# Patient Record
Sex: Male | Born: 1970 | Race: White | Hispanic: No | Marital: Married | Smoking: Never smoker
Health system: Southern US, Community
[De-identification: ages and names within clinical notes are randomized; demographics above are authoritative.]

## PROBLEM LIST (undated history)

## (undated) DIAGNOSIS — T7840XA Allergy, unspecified, initial encounter: Secondary | ICD-10-CM

## (undated) HISTORY — DX: Allergy, unspecified, initial encounter: T78.40XA

---

## 2008-04-11 HISTORY — PX: VASECTOMY: SHX75

## 2013-10-25 ENCOUNTER — Emergency Department (HOSPITAL_BASED_OUTPATIENT_CLINIC_OR_DEPARTMENT_OTHER)
Admission: EM | Admit: 2013-10-25 | Discharge: 2013-10-25 | Disposition: A | Discharge status: Home / Self Care | Payer: 59 | Attending: Emergency Medicine | Admitting: Emergency Medicine

## 2013-10-25 ENCOUNTER — Emergency Department (HOSPITAL_BASED_OUTPATIENT_CLINIC_OR_DEPARTMENT_OTHER): Payer: 59

## 2013-10-25 ENCOUNTER — Encounter (HOSPITAL_BASED_OUTPATIENT_CLINIC_OR_DEPARTMENT_OTHER): Payer: Self-pay | Admitting: Emergency Medicine

## 2013-10-25 DIAGNOSIS — X500XXA Overexertion from strenuous movement or load, initial encounter: Secondary | ICD-10-CM | POA: Insufficient documentation

## 2013-10-25 DIAGNOSIS — S92213A Displaced fracture of cuboid bone of unspecified foot, initial encounter for closed fracture: Secondary | ICD-10-CM | POA: Insufficient documentation

## 2013-10-25 DIAGNOSIS — Y9302 Activity, running: Secondary | ICD-10-CM | POA: Insufficient documentation

## 2013-10-25 DIAGNOSIS — S92211A Displaced fracture of cuboid bone of right foot, initial encounter for closed fracture: Secondary | ICD-10-CM

## 2013-10-25 DIAGNOSIS — Y9289 Other specified places as the place of occurrence of the external cause: Secondary | ICD-10-CM | POA: Insufficient documentation

## 2013-10-25 MED ORDER — HYDROCODONE-ACETAMINOPHEN 5-325 MG PO TABS: 1.0000 | ORAL_TABLET | ORAL | Status: DC | PRN

## 2013-10-25 NOTE — ED Notes (Signed)
MD at bedside. 

## 2013-10-25 NOTE — ED Notes (Signed)
Right foot injury tonight when he was running and stepped off a curb.

## 2013-10-25 NOTE — ED Notes (Signed)
Patient transported to X-ray 

## 2013-10-25 NOTE — ED Provider Notes (Signed)
CSN: [id number]     Arrival date & time 10/25/13  2052 History  This chart was scribed for Anthony Mccormick, * by Irene Pap, ED Scribe. This patient was seen in room MH01/MH01 and patient care was started at 9:01 PM.   Chief Complaint  Patient presents with  . Foot Injury   The history is provided by the patient. No language interpreter was used.   HPI Comments: Wise Fees is a 43 y.o. male who presents to the Emergency Department complaining of right foot injury onset a few hours ago. He states that he was running and missed the curb when he heard a pop in his right foot. He reports associated swelling to the area.   History reviewed. No pertinent past medical history. History reviewed. No pertinent past surgical history. No family history on file. History  Substance Use Topics  . Smoking status: Never Smoker   . Smokeless tobacco: Not on file  . Alcohol Use: Yes    Review of Systems  Musculoskeletal: Positive for arthralgias.  All other systems reviewed and are negative.   Allergies  Review of patient's allergies indicates no known allergies.  Home Medications   Prior to Admission medications   Not on File   BP 128/77  Pulse 75  Temp(Src) 98.4 F (36.9 C) (Oral)  Resp 20  Ht 6' (1.829 m)  Wt 155 lb (70.308 kg)  BMI 21.02 kg/m2  SpO2 100% Physical Exam  Constitutional: He is oriented to person, place, and time. He appears well-developed and well-nourished. No distress.  HENT:  Head: Normocephalic and atraumatic.  Right Ear: Hearing normal.  Left Ear: Hearing normal.  Nose: Nose normal.  Mouth/Throat: Oropharynx is clear and moist and mucous membranes are normal.  Eyes: Conjunctivae and EOM are normal. Pupils are equal, round, and reactive to light.  Neck: Normal range of motion. Neck supple.  Cardiovascular: Regular rhythm, S1 normal and S2 normal.  Exam reveals no gallop and no friction rub.   No murmur heard. Pulmonary/Chest: Effort normal and  breath sounds normal. No respiratory distress. He exhibits no tenderness.  Abdominal: Soft. Normal appearance and bowel sounds are normal. There is no hepatosplenomegaly. There is no tenderness. There is no rebound, no guarding, no tenderness at McBurney's point and negative Murphy's sign. No hernia.  Musculoskeletal: Normal range of motion.  Tenderness to right foot. Ecchymosis and swelling noted.   Neurological: He is alert and oriented to person, place, and time. He has normal strength. No cranial nerve deficit or sensory deficit. Coordination normal. GCS eye subscore is 4. GCS verbal subscore is 5. GCS motor subscore is 6.  Skin: Skin is warm, dry and intact. No rash noted. No cyanosis.  Psychiatric: He has a normal mood and affect. His speech is normal and behavior is normal. Thought content normal.    ED Course  Procedures (including critical care time) DIAGNOSTIC STUDIES: Oxygen Saturation is 100% on room air, normal by my interpretation.    COORDINATION OF CARE: 9:04 PM-Discussed treatment plan which includes x-ray with pt at bedside and pt agreed to plan.   Labs Review Labs Reviewed - No data to display  Imaging Review Dg Foot Complete Right  10/25/2013   CLINICAL DATA:  Right foot injury.  Foot pain.  EXAM: RIGHT FOOT COMPLETE - 3+ VIEW  COMPARISON:  None.  FINDINGS: A mildly comminuted fracture is seen involving the cuboid. No other fractures are identified. No evidence of dislocation.  IMPRESSION: Mildly comminuted fracture of  the cuboid.   Electronically Signed   By: Earle Gell M.D.   On: 10/25/2013 21:16     EKG Interpretation None      MDM   Final diagnoses:  None   foot fracture  Patient presents to the emergency department for evaluation of right foot injury. Patient jumped off a curb and felt a pop in the outside portion of his right foot. He presents with marked swelling and some ecchymosis in the region. X-ray confirms cuboid fracture. Patient placed in  posterior splint, nonweightbearing status. Vicodin as needed for pain. Patient reports that he will followup with his orthopedist in [facility], was also given a referral for our on-call orthopedist in the event that he cannot get an appointment in [facility].  I personally performed the services described in this documentation, which was scribed in my presence. The recorded information has been reviewed and is accurate.     Anthony Greek, MD 10/25/13 2203

## 2013-10-25 NOTE — Discharge Instructions (Signed)
Tarsal Fracture  with Rehab The tarsal bones are a collection of 7 bones that collectively make up the back half of the foot (hind foot). A tarsal fracture is a break in one of these bones. The 7 tarsal bones are the ankle bone (talus), heel bone (calcaneus), the 4 cuneiform bones, the cuboid, and the navicular. SYMPTOMS   Severe pain at the time of injury, which may persist for several weeks.  Tenderness, inflammation, or bruising (contusion) at the fracture site.  Swelling within the foot, causing numbness and paralysis, which are signs of nerve and blood vessel damage (uncommon). CAUSES  Tarsal fractures occur when a force is placed on the bone that is greater than the bone can withstand. Common causes of injury include:  Direct trauma (injury from impact) to the foot.  Awkwardly landing on the foot after jumping.  Twisting injury to the ankle and foot. RISK INCREASES WITH:  Contact sports (football, rugby, or lacrosse).  Jumping sports (basketball or volleyball).  Previous ankle or foot injury.  Poor strength and flexibility. PREVENTION  Warm up and stretch properly before activity.  Maintain physical fitness:  Strength, flexibility, and endurance.  Cardiovascular fitness (increases heart rate).  Protect the ankle and foot with taping, braces, or compression bandages.  Wear properly fitted shoes that are appropriate for the activity. PROGNOSIS  If treated properly, tarsal fractures can be expected to heal with non-surgical treatment. Occasionally, surgery is necessary to treat bone fragments that are out of alignment (displaced fracture). RELATED COMPLICATIONS   Failure of the fracture to heal (nonunion).  Healing of the fracture in a poor position (malunion).  Recurring symptoms that result in a chronic problem.  Prolonged healing time, if improperly treated or re-injured.  Bone death, due to poor circulation to the fracture site (rare).  Arthritis of the  foot. TREATMENT  Treatment first involves the use of ice and medicine, to reduce pain and inflammation. If the bone fragments are out of alignment (displaced fracture), then the bone must be immediately realigned (reduction) by a trained professional. Fractures that cannot be realigned by hand, or fractures where the bones poke out through the skin (open fracture), may require surgery to hold the fracture in place with screws, pins, and plates. Once in proper alignment, the foot and ankle must be kept still for a period of time to allow for healing. After this period, it is important to perform strengthening and stretching exercises to help regain strength and a full range of motion. These exercises may be completed at home or with a therapist.  MEDICATION   If pain medicine is necessary, nonsteroidal anti-inflammatory medications (aspirin and ibuprofen) or other minor pain relievers (acetaminophen) are often recommended.  Do not take pain medication for 7 days before surgery.  Prescription pain relievers may be given if your caregiver thinks they are necessary. Use only as directed and only as much as you need. COLD THERAPY  Cold treatment (icing) relieves pain and reduces inflammation. Cold treatment should be applied for 10 to 15 minutes every 2 to 3 hours, and immediately after any activity that aggravates your symptoms. Use ice packs or an ice massage. SEEK MEDICAL CARE IF:   Treatment does not seem to help, or the condition worsens.  Any medicines produce negative side effects.  Any complications from surgery occur:  Pain, numbness, or coldness in the affected foot.  Discoloration beneath the toenails (blue or gray) of the affected foot.  Signs of infections (fever, pain, inflammation, redness,  or persistent bleeding).

## 2015-08-28 IMAGING — CR DG FOOT COMPLETE 3+V*R*
3 series · 3 of 3 positions shown · non-contrast
Comparison: None.

CLINICAL DATA: Right foot injury.  Foot pain.

EXAM:
RIGHT FOOT COMPLETE - 3+ VIEW

[t foot ap right]
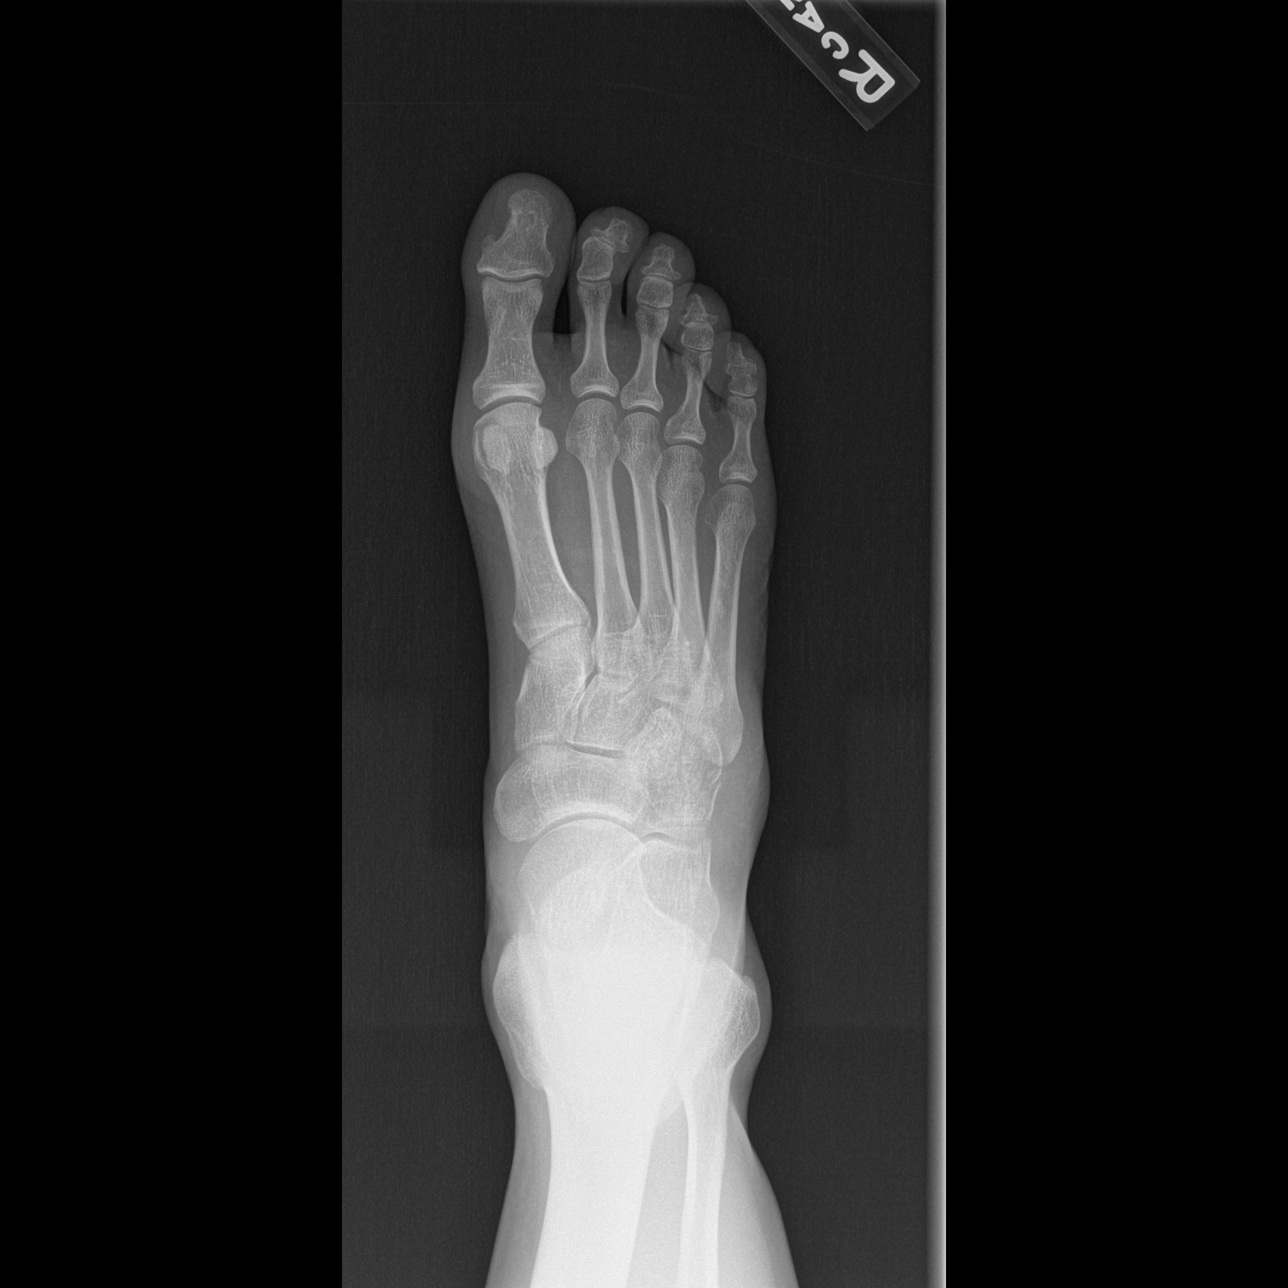

[t foot oblique right]
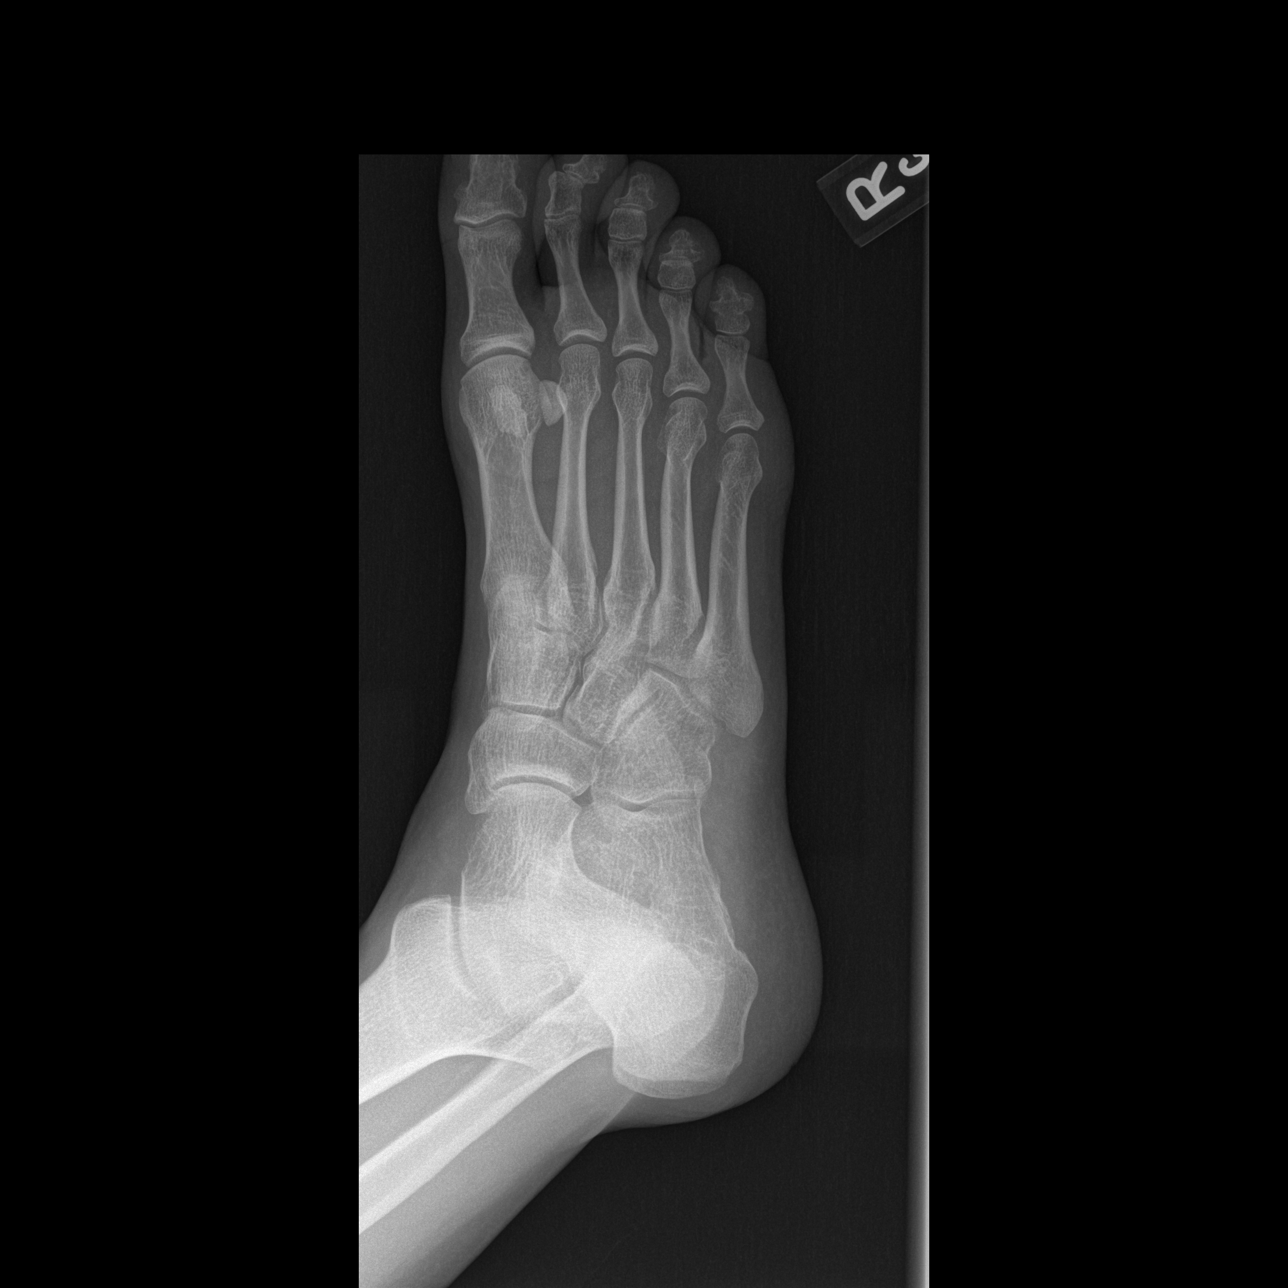

[t foot lat right]
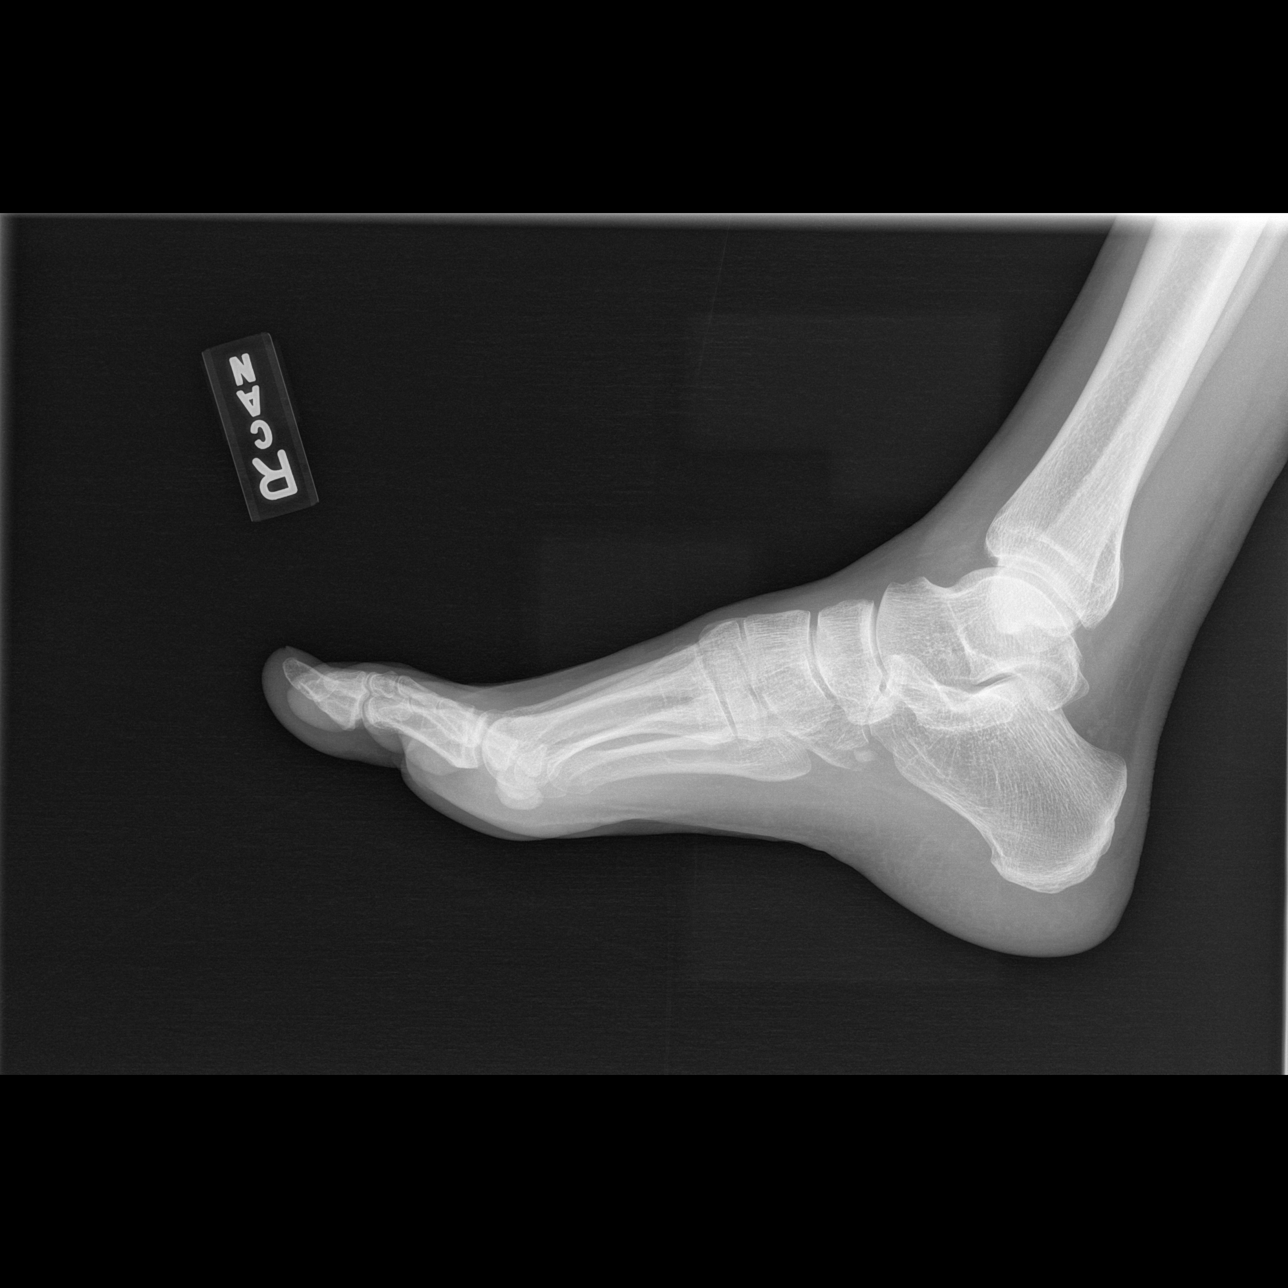

[3 of 3 positions shown; findings below may reference images not displayed]

FINDINGS: A mildly comminuted fracture is seen involving the cuboid. No other
fractures are identified. No evidence of dislocation.
IMPRESSION: Mildly comminuted fracture of the cuboid.

## 2019-06-14 ENCOUNTER — Ambulatory Visit: Payer: 59 | Attending: Internal Medicine

## 2019-06-14 DIAGNOSIS — Z23 Encounter for immunization: Secondary | ICD-10-CM | POA: Insufficient documentation

## 2019-06-14 NOTE — Progress Notes (Signed)
   Covid-19 Vaccination Clinic  Name:  Elwood Gerhard    MRN: [id number] DOB: [birth year]  06/14/2019  Mr. Oclair was observed post Covid-19 immunization for 15 minutes without incident. He was provided with Vaccine Information Sheet and instruction to access the V-Safe system.   Mr. Gima was instructed to call 911 with any severe reactions post vaccine: Marland Kitchen Difficulty breathing  . Swelling of face and throat  . A fast heartbeat  . A bad rash all over body  . Dizziness and weakness

## 2019-07-10 ENCOUNTER — Ambulatory Visit: Payer: 59 | Attending: Internal Medicine

## 2019-07-10 DIAGNOSIS — Z23 Encounter for immunization: Secondary | ICD-10-CM

## 2019-07-10 NOTE — Progress Notes (Signed)
   Covid-19 Vaccination Clinic  Name:  Anthony Mccormick    MRN: [id number] DOB: Jul 19, 1970  07/10/2019  [name] was observed post Covid-19 immunization for 15 minutes without incident. He was provided with Vaccine Information Sheet and instruction to access the V-Safe system.   [name] was instructed to call 911 with any severe reactions post vaccine: Marland Kitchen Difficulty breathing  . Swelling of face and throat  . A fast heartbeat  . A bad rash all over body  . Dizziness and weakness   Immunizations Administered    Name Date Dose VIS Date Route   Pfizer COVID-19 Vaccine 07/10/2019  3:15 PM 0.3 mL 03/22/2019 Intramuscular   Manufacturer: [place]   Lot: [id number]   Benedict: [id number]

## 2021-01-22 ENCOUNTER — Encounter: Payer: Self-pay | Admitting: Family Medicine

## 2021-01-22 ENCOUNTER — Ambulatory Visit (INDEPENDENT_AMBULATORY_CARE_PROVIDER_SITE_OTHER): Payer: 59 | Admitting: Family Medicine

## 2021-01-22 ENCOUNTER — Other Ambulatory Visit: Payer: Self-pay

## 2021-01-22 VITALS — BP 122/74 | HR 59 | Temp 98.1°F | Ht 71.0 in | Wt 178.0 lb

## 2021-01-22 DIAGNOSIS — Z0000 Encounter for general adult medical examination without abnormal findings: Secondary | ICD-10-CM

## 2021-01-22 DIAGNOSIS — Z125 Encounter for screening for malignant neoplasm of prostate: Secondary | ICD-10-CM | POA: Diagnosis not present

## 2021-01-22 DIAGNOSIS — Z114 Encounter for screening for human immunodeficiency virus [HIV]: Secondary | ICD-10-CM

## 2021-01-22 DIAGNOSIS — C4491 Basal cell carcinoma of skin, unspecified: Secondary | ICD-10-CM | POA: Insufficient documentation

## 2021-01-22 DIAGNOSIS — Z23 Encounter for immunization: Secondary | ICD-10-CM | POA: Diagnosis not present

## 2021-01-22 DIAGNOSIS — Z1159 Encounter for screening for other viral diseases: Secondary | ICD-10-CM | POA: Diagnosis not present

## 2021-01-22 DIAGNOSIS — Z1211 Encounter for screening for malignant neoplasm of colon: Secondary | ICD-10-CM

## 2021-01-22 NOTE — Progress Notes (Signed)
[facility] [address] [place] [place] [address] Dept: [phone] Dept Fax: [phone]  New Patient Office Visit  Subjective:    Patient ID: Anthony Mccormick, male    DOB: December 10, 1970, 50 y.o..   MRN: [id number]  Chief Complaint  Patient presents with   Establish Care    NP- establish care.  Wants to get colonoscopy scheduled.  Wants flu shot today.     History of Present Illness:  Patient is in today to establish care. Anthony Mccormick was born in [place], [place]. He attended college at [employer], majoring in Health visitor. He moved to [place] in 1993. 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 He has been married for 24 years. He has a daughter (4, Anthony [employer]cc[employer]ick) and a son (81). He denies tobacco or drug use. He admits to drinkign several beers each evening.  Anthony Mccormick has a history fo multiple basal cell carcinomas. He sees Anthony Mccormick at [facility]. He has had multiple lesions frozen, but did have one excised on the right chest wall. He goes in 3 times a year to monitor for these.  Review of Systems  Constitutional: Negative.   HENT: Negative.    Eyes: Negative.   Respiratory:  Negative for cough, shortness of breath and wheezing.   Cardiovascular:  Positive for chest pain and palpitations. Negative for orthopnea, claudication, leg swelling and PND.       Notes episodes over the past year of sharp chest pain that lasts for seconds. He isn't sure if he feels a rapid heart rate or not. He has no associated dyspnea.  Gastrointestinal: Negative.   Genitourinary: Negative.   Musculoskeletal: Negative.   Skin: Negative.   Neurological: Negative.   Endo/Heme/Allergies: Negative.   Psychiatric/Behavioral: Negative.     Past Medical History: Patient Active Problem List   Diagnosis Date Noted   Basal cell carcinoma 01/22/2021   Past Surgical  History:  Procedure Laterality Date   VASECTOMY  2010   Family History  Problem Relation Age of Onset   Heart disease Mother    Stroke Mother    Cancer Paternal Aunt        Ovarian   Dementia Maternal Grandmother    Heart disease Maternal Grandfather    Heart disease Paternal Grandmother    Heart disease Paternal Grandfather    Outpatient Medications Prior to Visit  Medication Sig Dispense Refill   HYDROcodone-acetaminophen (NORCO/VICODIN) 5-325 MG per tablet Take 1-2 tablets by mouth every 4 (four) hours as needed for moderate pain. 30 tablet 0   No facility-administered medications prior to visit.   No Known Allergies    Objective:   Today's Vitals   01/22/21 1307  BP: 122/74  Pulse: (!) 59  Temp: 98.1 F (36.7 C)  TempSrc: Temporal  SpO2: 98%  Weight: 178 lb (80.7 kg)  Height: 5\' 11"  (1.803 m)   Body mass index is 24.83 kg/m.   General: Well developed, well nourished. No acute distress. HEENT: Normocephalic, non-traumatic. PERRL, EOMI. Conjunctiva clear. Fundiscopic exam   shows normal disc and vasculature. External ears normal. EAC and TMs normal bilaterally.   Nose clear without congestion or rhinorrhea. Mucous membranes moist. Oropharynx clear.   Good dentition. Neck: Supple. No lymphadenopathy. No thyromegaly. Lungs: Clear to auscultation bilaterally. No wheezing, rales or rhonchi. CV: RRR without murmurs or rubs. Pulses 2+ bilaterally. Abdomen: Soft, non-tender. Bowel sounds positive, normal pitch and frequency.  No masses. Psych: Alert and oriented. Normal mood and affect.  Health Maintenance Due  Topic Date Due   Hepatitis C Screening  Never done   COLONOSCOPY (Pts 45-28yrs Insurance coverage will need to be confirmed)  Never done   Zoster Vaccines- Shingrix (1 of 2) Never done   INFLUENZA VACCINE  Never done     Assessment & Plan:   1. Encounter for preventative adult health care examination Overall excellent health. We reviewed recommended  screenings. [name] notes he had a lipid panel done for work. He will provide me with a copy.  2. Encounter for hepatitis C screening test for low risk patient  - HCV Ab w Reflex to Quant PCR  3. Screening for HIV (human immunodeficiency virus)  - HIV Antibody (routine testing w rflx)  4. Screening for prostate cancer Discussed screening for prostate cancer. After engaging in shared decision-making, we will move ahead with testing.  - PSA  5. Screening for colon cancer  - Ambulatory referral to Gastroenterology  6. Need for shingles vaccine  - Varicella-zoster vaccine IM (Shingrix)  7. Need for influenza vaccination  - Flu Vaccine QUAD 6+ mos PF IM (Fluarix Quad PF)  Anthony Salter, MD

## 2021-01-23 LAB — HCV AB W REFLEX TO QUANT PCR: HCV Ab: <0.1 s/co ratio (ref 0.0–0.9)

## 2021-01-23 LAB — HCV INTERPRETATION

## 2021-01-25 LAB — PSA: PSA: 0.43 ng/mL (ref <=4.00)

## 2021-01-25 LAB — HIV ANTIBODY (ROUTINE TESTING W REFLEX): HIV 1&2 Ab, 4th Generation: NONREACTIVE

## 2021-02-03 ENCOUNTER — Encounter: Payer: Self-pay | Admitting: Gastroenterology

## 2021-03-25 ENCOUNTER — Other Ambulatory Visit: Payer: Self-pay

## 2021-03-25 ENCOUNTER — Ambulatory Visit (AMBULATORY_SURGERY_CENTER): Payer: Self-pay

## 2021-03-25 VITALS — Ht 72.0 in | Wt 170.0 lb

## 2021-03-25 DIAGNOSIS — Z1211 Encounter for screening for malignant neoplasm of colon: Secondary | ICD-10-CM

## 2021-03-25 MED ORDER — NA SULFATE-K SULFATE-MG SULF 17.5-3.13-1.6 GM/177ML PO SOLN: 1.0000 | Freq: Once | ORAL | 0 refills | Status: AC

## 2021-03-25 NOTE — Progress Notes (Signed)
Denies allergies to eggs or soy products. Denies complication of anesthesia or sedation. Denies use of weight loss medication. Denies use of O2.   Emmi instructions given for colonoscopy.  

## 2021-03-26 ENCOUNTER — Encounter: Payer: Self-pay | Admitting: Gastroenterology

## 2021-04-08 ENCOUNTER — Encounter: Payer: Self-pay | Admitting: Family Medicine

## 2021-04-08 ENCOUNTER — Ambulatory Visit (AMBULATORY_SURGERY_CENTER): Payer: 59 | Admitting: Gastroenterology

## 2021-04-08 ENCOUNTER — Other Ambulatory Visit: Payer: Self-pay

## 2021-04-08 ENCOUNTER — Encounter: Payer: Self-pay | Admitting: Gastroenterology

## 2021-04-08 VITALS — BP 116/73 | HR 59 | Temp 98.9°F | Resp 16 | Ht 71.0 in | Wt 170.0 lb

## 2021-04-08 DIAGNOSIS — Z1211 Encounter for screening for malignant neoplasm of colon: Secondary | ICD-10-CM

## 2021-04-08 DIAGNOSIS — K5790 Diverticulosis of intestine, part unspecified, without perforation or abscess without bleeding: Secondary | ICD-10-CM | POA: Insufficient documentation

## 2021-04-08 MED ORDER — SODIUM CHLORIDE 0.9 % IV SOLN: 500.0000 mL | Freq: Once | INTRAVENOUS | Status: DC

## 2021-04-08 NOTE — Op Note (Signed)
[facility] Patient Name: Anthony Mccormick Procedure Date: 04/08/2021 8:21 AM MRN: [id number] Endoscopist: Mallie Mussel L. Loletha Carrow , MD Age: 50 Referring MD:  Date of Birth: [birth year] Gender: Male Account #: [id number] Procedure:                Colonoscopy Indications:              Screening for colorectal malignant neoplasm, This                            is the patient's first colonoscopy Medicines:                Monitored Anesthesia Care Procedure:                Pre-Anesthesia Assessment:                           - Prior to the procedure, a History and Physical                            was performed, and patient medications and                            allergies were reviewed. The patient's tolerance of                            previous anesthesia was also reviewed. The risks                            and benefits of the procedure and the sedation                            options and risks were discussed with the patient.                            All questions were answered, and informed consent                            was obtained. Prior Anticoagulants: The patient has                            taken no previous anticoagulant or antiplatelet                            agents. ASA Grade Assessment: I - A normal, healthy                            patient. After reviewing the risks and benefits,                            the patient was deemed in satisfactory condition to                            undergo the procedure.  After obtaining informed consent, the colonoscope                            was passed under direct vision. Throughout the                            procedure, the patient's blood pressure, pulse, and                            oxygen saturations were monitored continuously. The                            CF [id number] #[id number] was introduced through the anus                            and advanced to the the cecum,  identified by                            appendiceal orifice and ileocecal valve. The                            colonoscopy was performed without difficulty. The                            patient tolerated the procedure well. The quality                            of the bowel preparation was excellent. The                            ileocecal valve, appendiceal orifice, and rectum                            were photographed. The bowel preparation used was                            SUPREP. Scope In: 8:35:17 AM Scope Out: 8:46:01 AM Scope Withdrawal Time: 0 hours 8 minutes 27 seconds  Total Procedure Duration: 0 hours 10 minutes 44 seconds  Findings:                 The perianal and digital rectal examinations were                            normal.                           A single small-mouthed diverticulum was found in                            the descending colon.                           Repeat examination of right colon under NBI  performed.                           The exam was otherwise without abnormality on                            direct and retroflexion views. Complications:            No immediate complications. Estimated Blood Loss:     Estimated blood loss: none. Impression:               - Diverticulosis in the descending colon.                           - The examination was otherwise normal on direct                            and retroflexion views.                           - No specimens collected. Recommendation:           - Patient has a contact number available for                            emergencies. The signs and symptoms of potential                            delayed complications were discussed with the                            patient. Return to normal activities tomorrow.                            Written discharge instructions were provided to the                            patient.                           -  Resume previous diet.                           - Continue present medications.                           - Repeat colonoscopy in 10 years for screening                            purposes. Vinal Rosengrant L. Loletha Carrow, MD 04/08/2021 8:51:05 AM This report has been signed electronically.

## 2021-04-08 NOTE — Progress Notes (Signed)
History and Physical:  This patient presents for endoscopic testing for: Encounter Diagnosis  Name Primary?   Special screening for malignant neoplasms, colon Yes    First colonoscopy - average risk Patient denies chronic abdominal pain, rectal bleeding, constipation or diarrhea.   ROS: Patient denies chest pain or cough   Past Medical History: Past Medical History:  Diagnosis Date   Allergy      Past Surgical History: Past Surgical History:  Procedure Laterality Date   VASECTOMY  2010    Allergies: No Known Allergies  Outpatient Meds: No current outpatient medications on file.   Current Facility-Administered Medications  Medication Dose Route Frequency Provider Last Rate Last Admin   0.9 %  sodium chloride infusion  500 mL Intravenous Once Doran Stabler, MD          ___________________________________________________________________ Objective   Exam:  BP 130/87    Pulse 71    Temp 98.9 F (37.2 C)    Ht 5\' 11"  (1.803 m)    Wt 170 lb (77.1 kg)    SpO2 98%    BMI 23.71 kg/m   CV: RRR without murmur, S1/S2 Resp: clear to auscultation bilaterally, normal RR and effort noted GI: soft, no tenderness, with active bowel sounds.   Assessment: Encounter Diagnosis  Name Primary?   Special screening for malignant neoplasms, colon Yes     Plan: Colonoscopy  The benefits and risks of the planned procedure were described in detail with the patient or (when appropriate) their health care proxy.  Risks were outlined as including, but not limited to, bleeding, infection, perforation, adverse medication reaction leading to cardiac or pulmonary decompensation, pancreatitis (if ERCP).  The limitation of incomplete mucosal visualization was also discussed.  No guarantees or warranties were given.    The patient is appropriate for an endoscopic procedure in the ambulatory setting.   - Wilfrid Lund, MD

## 2021-04-08 NOTE — Patient Instructions (Signed)
Handout on Diverticulosis given.  YOU HAD AN ENDOSCOPIC PROCEDURE TODAY AT [facility]:   Refer to the procedure report that was given to you for any specific questions about what was found during the examination.  If the procedure report does not answer your questions, please call your gastroenterologist to clarify.  If you requested that your care partner not be given the details of your procedure findings, then the procedure report has been included in a sealed envelope for you to review at your convenience later.  YOU SHOULD EXPECT: Some feelings of bloating in the abdomen. Passage of more gas than usual.  Walking can help get rid of the air that was put into your GI tract during the procedure and reduce the bloating. If you had a lower endoscopy (such as a colonoscopy or flexible sigmoidoscopy) you may notice spotting of blood in your stool or on the toilet paper. If you underwent a bowel prep for your procedure, you may not have a normal bowel movement for a few days.  Please Note:  You might notice some irritation and congestion in your nose or some drainage.  This is from the oxygen used during your procedure.  There is no need for concern and it should clear up in a day or so.  SYMPTOMS TO REPORT IMMEDIATELY:  Following lower endoscopy (colonoscopy or flexible sigmoidoscopy):  Excessive amounts of blood in the stool  Significant tenderness or worsening of abdominal pains  Swelling of the abdomen that is new, acute  Fever of 100F or higher  For urgent or emergent issues, a gastroenterologist can be reached at any hour by calling [phone]. Do not use MyChart messaging for urgent concerns.    DIET:  We do recommend a small meal at first, but then you may proceed to your regular diet.  Drink plenty of fluids but you should avoid alcoholic beverages for 24 hours.  ACTIVITY:  You should plan to take it easy for the rest of today and you should NOT DRIVE or use heavy  machinery until tomorrow (because of the sedation medicines used during the test).    FOLLOW UP: Our staff will call the number listed on your records 48-72 hours following your procedure to check on you and address any questions or concerns that you may have regarding the information given to you following your procedure. If we do not reach you, we will leave a message.  We will attempt to reach you two times.  During this call, we will ask if you have developed any symptoms of COVID 19. If you develop any symptoms (ie: fever, flu-like symptoms, shortness of breath, cough etc.) before then, please call [phone].  If you test positive for Covid 19 in the 2 weeks post procedure, please call and report this information to [place].    If any biopsies were taken you will be contacted by phone or by letter within the next 1-3 weeks.  Please call us at [phone] if you have not heard about the biopsies in 3 weeks.    SIGNATURES/CONFIDENTIALITY: You and/or your care partner have signed paperwork which will be entered into your electronic medical record.  These signatures attest to the fact that that the information above on your After Visit Summary has been reviewed and is understood.  Full responsibility of the confidentiality of this discharge information lies with you and/or your care-partner.

## 2021-04-08 NOTE — Progress Notes (Signed)
VS-CW  Pt's states no medical or surgical changes since previsit or office visit.  

## 2021-04-14 ENCOUNTER — Telehealth: Payer: Self-pay

## 2021-04-14 NOTE — Telephone Encounter (Signed)
°  Follow up Call-  Call back number 04/08/2021  Post procedure Call Back phone  # [phone]  Permission to leave phone message Yes  Some recent data might be hidden     Patient questions:  Do you have a fever, pain , or abdominal swelling? No. Pain Score  0 *  Have you tolerated food without any problems? Yes.    Have you been able to return to your normal activities? Yes.    Do you have any questions about your discharge instructions: Diet   No. Medications  No. Follow up visit  No.  Do you have questions or concerns about your Care? No.  Actions: * If pain score is 4 or above: No action needed, pain <4.  Have you developed a fever since your procedure? no  2.   Have you had an respiratory symptoms (SOB or cough) since your procedure? no  3.   Have you tested positive for COVID 19 since your procedure no  4.   Have you had any family members/close contacts diagnosed with the COVID 19 since your procedure?  no   If yes to any of these questions please route to Joylene John, RN and Joella Prince, RN

## 2022-01-28 ENCOUNTER — Encounter: Payer: 59 | Admitting: Family Medicine

## 2022-02-07 ENCOUNTER — Ambulatory Visit (INDEPENDENT_AMBULATORY_CARE_PROVIDER_SITE_OTHER): Payer: 59 | Admitting: Family Medicine

## 2022-02-07 ENCOUNTER — Encounter: Payer: Self-pay | Admitting: Family Medicine

## 2022-02-07 VITALS — BP 126/74 | HR 73 | Temp 97.9°F | Ht 71.0 in | Wt 174.4 lb

## 2022-02-07 DIAGNOSIS — Z13220 Encounter for screening for lipoid disorders: Secondary | ICD-10-CM | POA: Diagnosis not present

## 2022-02-07 DIAGNOSIS — Z23 Encounter for immunization: Secondary | ICD-10-CM | POA: Diagnosis not present

## 2022-02-07 DIAGNOSIS — Z0000 Encounter for general adult medical examination without abnormal findings: Secondary | ICD-10-CM | POA: Diagnosis not present

## 2022-02-07 DIAGNOSIS — Z125 Encounter for screening for malignant neoplasm of prostate: Secondary | ICD-10-CM | POA: Diagnosis not present

## 2022-02-07 NOTE — Progress Notes (Signed)
[facility] [address] [place] [address] Dept: [phone] Dept Fax: [phone]  Annual Physical Visit  Subjective:    Patient ID: Anthony Mccormick, male    DOB: 26-Apr-1970, 51 y.o..   MRN: [id number]  Chief Complaint  Patient presents with   Annual Exam    CPE/labs.  Not Fasting today. C/o having some neck pain off/on.   Wants flu and shingles vaccine today.     History of Present Illness:  Patient is in today for an annual physical/preventative visit.  Review of Systems  Constitutional:  Negative for chills, diaphoresis, fever, malaise/fatigue and weight loss.  HENT:  Negative for congestion, ear discharge, ear pain, hearing loss, sinus pain and sore throat.   Eyes:  Negative for blurred vision, pain, discharge and redness.  Respiratory:  Negative for cough, shortness of breath and wheezing.   Cardiovascular:  Negative for chest pain, palpitations and leg swelling.  Gastrointestinal:  Positive for heartburn. Negative for abdominal pain, constipation, diarrhea, nausea and vomiting.       Has occasional episodes of heartburn. Often triggered by eating dinner late. Manages with Tums.   Genitourinary:        Occasionally notes small knot in right scrotum.  Musculoskeletal:  Negative for joint pain and myalgias.  Skin:  Negative for itching and rash.  Endo/Heme/Allergies:  Negative for environmental allergies.   Past Medical History: Patient Active Problem List   Diagnosis Date Noted   Diverticulosis 04/08/2021   Basal cell carcinoma 01/22/2021   Past Surgical History:  Procedure Laterality Date   VASECTOMY  2010   Family History  Problem Relation Age of Onset   Heart disease Mother    Stroke Mother    Cancer Paternal Aunt        Ovarian   Dementia Maternal Grandmother    Heart disease Maternal Grandfather    Heart disease Paternal Grandmother    Heart disease Paternal Grandfather    Colon cancer Neg Hx     Esophageal cancer Neg Hx    Rectal cancer Neg Hx    Stomach cancer Neg Hx    No outpatient medications prior to visit.   No facility-administered medications prior to visit.   No Known Allergies    Objective:   Today's Vitals   02/07/22 1038  BP: 126/74  Pulse: 73  Temp: 97.9 F (36.6 C)  TempSrc: Temporal  SpO2: 93%  Weight: 174 lb 6.4 oz (79.1 kg)  Height: '5\' 11"'$  (1.803 m)   Body mass index is 24.32 kg/m.   General: Well developed, well nourished. No acute distress. HEENT: Normocephalic, non-traumatic. PERRL, EOMI. Conjunctiva clear. Fundiscopic exam shows   normal disc and vasculature. External ears normal. EAC and TMs normal bilaterally. Nose    clear without congestion or rhinorrhea. Mucous membranes moist. Oropharynx clear. Good dentition. Neck: Supple. No lymphadenopathy. No thyromegaly. Lungs: Clear to auscultation bilaterally. No wheezing, rales or rhonchi. CV: RRR without murmurs or rubs. Pulses 2+ bilaterally. Abdomen: Soft, non-tender. Bowel sounds positive, normal pitch and frequency. No   hepatosplenomegaly. No rebound or guarding. GU: Normal circumcised male. Testicles of normal size and consistency. No scrotal masses noted. Extremities: Full ROM. No joint swelling or tenderness. No edema noted. Skin: Warm and dry. No rashes. Psych: Alert and oriented. Normal mood and affect.  Health Maintenance Due  Topic Date Due   Zoster Vaccines- Shingrix (2 of 2) 03/19/2021   INFLUENZA VACCINE  11/09/2021     Assessment &  Plan:   1. Annual physical exam Overall excellent health. Recommend he plan to start seeing an ophthalmologist for annual eye exams. Reviewed recommended screenings and immunizations.  2. Screening for lipid disorders  - Lipid panel; Future  3. Screening for prostate cancer  - PSA; Future  4. Need for zoster vaccination  - Varicella-zoster vaccine subcutaneous  Return in about 1 year (around 02/08/2023) for Annual preventative  care.   Haydee Salter, MD

## 2022-02-08 ENCOUNTER — Other Ambulatory Visit (INDEPENDENT_AMBULATORY_CARE_PROVIDER_SITE_OTHER): Payer: 59

## 2022-02-08 DIAGNOSIS — Z125 Encounter for screening for malignant neoplasm of prostate: Secondary | ICD-10-CM | POA: Diagnosis not present

## 2022-02-08 DIAGNOSIS — Z13220 Encounter for screening for lipoid disorders: Secondary | ICD-10-CM

## 2022-02-08 LAB — PSA: PSA: 0.35 ng/mL (ref 0.10–4.00)

## 2022-02-08 LAB — LIPID PANEL
Cholesterol: 202 mg/dL — ABNORMAL HIGH (ref 0–200)
HDL: 69.6 mg/dL (ref >39.00)
LDL Cholesterol: 122 mg/dL — ABNORMAL HIGH (ref 0–99)
NonHDL: 132.63
Total CHOL/HDL Ratio: 3
Triglycerides: 52 mg/dL (ref 0.0–149.0)
VLDL: 10.4 mg/dL (ref 0.0–40.0)

## 2022-02-11 ENCOUNTER — Encounter: Payer: Self-pay | Admitting: Family Medicine

## 2023-02-10 ENCOUNTER — Encounter: Payer: Self-pay | Admitting: Family Medicine

## 2023-02-10 ENCOUNTER — Ambulatory Visit (INDEPENDENT_AMBULATORY_CARE_PROVIDER_SITE_OTHER): Payer: 59 | Admitting: Family Medicine

## 2023-02-10 VITALS — BP 118/76 | HR 69 | Temp 97.6°F | Ht 71.0 in | Wt 175.4 lb

## 2023-02-10 DIAGNOSIS — N529 Male erectile dysfunction, unspecified: Secondary | ICD-10-CM | POA: Diagnosis not present

## 2023-02-10 DIAGNOSIS — Z23 Encounter for immunization: Secondary | ICD-10-CM

## 2023-02-10 DIAGNOSIS — Z13220 Encounter for screening for lipoid disorders: Secondary | ICD-10-CM

## 2023-02-10 DIAGNOSIS — Z125 Encounter for screening for malignant neoplasm of prostate: Secondary | ICD-10-CM | POA: Diagnosis not present

## 2023-02-10 DIAGNOSIS — T63481A Toxic effect of venom of other arthropod, accidental (unintentional), initial encounter: Secondary | ICD-10-CM | POA: Insufficient documentation

## 2023-02-10 DIAGNOSIS — Z0000 Encounter for general adult medical examination without abnormal findings: Secondary | ICD-10-CM | POA: Diagnosis not present

## 2023-02-10 LAB — LIPID PANEL
Cholesterol: 224 mg/dL — ABNORMAL HIGH (ref 0–200)
HDL: 79.6 mg/dL (ref >39.00)
LDL Cholesterol: 126 mg/dL — ABNORMAL HIGH (ref 0–99)
NonHDL: 144.35
Total CHOL/HDL Ratio: 3
Triglycerides: 94 mg/dL (ref 0.0–149.0)
VLDL: 18.8 mg/dL (ref 0.0–40.0)

## 2023-02-10 LAB — PSA: PSA: 0.59 ng/mL (ref 0.10–4.00)

## 2023-02-10 MED ORDER — SILDENAFIL CITRATE 50 MG PO TABS: 50.0000 mg | ORAL_TABLET | Freq: Every day | ORAL | 6 refills | Status: AC | PRN

## 2023-02-10 NOTE — Progress Notes (Signed)
[facility] [address] [place] [address] Dept: [phone] Dept Fax: [phone]  Annual Physical Visit  Subjective:    Patient ID: Anthony Mccormick, male    DOB: 1970-08-06, 52 y.o..   MRN: [id number]  Chief Complaint  Patient presents with   Annual Exam    CPE/labs.  Fasting today.  No concerns. Will get flu shot.    History of Present Illness:  Patient is in today for an annual physical/preventative visit. Has been going tot he [employer] regularly for classes and feels his weight has decreased a small amount.  Review of Systems  Constitutional:  Negative for chills, diaphoresis, fever, malaise/fatigue and weight loss.  HENT:  Positive for hearing loss. Negative for congestion, ear pain, sinus pain, sore throat and tinnitus.        Notes mild low frequency hearing loss and difficulty hearing in crowded settings.  Eyes:  Negative for blurred vision, pain, discharge and redness.  Respiratory:  Negative for cough, shortness of breath and wheezing.   Cardiovascular:  Negative for chest pain and palpitations.  Gastrointestinal:  Positive for heartburn. Negative for abdominal pain, constipation, diarrhea, nausea and vomiting.       Notes certain food triggers. Responds well to occasional Tums.  Genitourinary:        Notes occasional issue with erection lasting long enough during intercourse.  Musculoskeletal:  Negative for back pain, joint pain and myalgias.  Skin:  Negative for itching and rash.  Psychiatric/Behavioral:  Negative for depression. The patient is not nervous/anxious.    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   No facility-administered medications prior to visit.   No Known Allergies Objective:   Today's Vitals   02/10/23 0804  BP: 118/76  Pulse: 69  Temp: 97.6 F (36.4 C)  TempSrc: Temporal  SpO2: 97%  Weight: 175 lb 6.4 oz (79.6 kg)  Height: 5\' 11"  (1.803 m)   Body mass index is 24.46 kg/m.   General: Well developed, well nourished. No acute distress. HEENT: Normocephalic, non-traumatic. PERRL, EOMI. Conjunctiva clear. External ears normal. EAC and TMs normal   bilaterally. Nose clear without congestion or rhinorrhea. Mucous membranes moist. Oropharynx clear. Good dentition. Neck: Supple. No lymphadenopathy. No thyromegaly. Lungs: Clear to auscultation bilaterally. No wheezing, rales or rhonchi. CV: RRR without murmurs or rubs. Pulses 2+ bilaterally. Abdomen: Soft, non-tender. Bowel sounds positive, normal pitch and frequency. No hepatosplenomegaly. No rebound   or guarding. Extremities: Full ROM. No joint swelling or tenderness. No edema noted. Skin: Warm and dry. No rashes. Psych: Alert and oriented. Normal mood and affect.  Health Maintenance Due  Topic Date Due   INFLUENZA VACCINE  11/10/2022   DTaP/Tdap/Td (  2 - Td or Tdap) 01/05/2023     Assessment & Plan:   Problem List Items Addressed This Visit       Other   Erectile dysfunction    I recommend a trial of Viagra 25-50 mg as needed.      Relevant Medications   sildenafil (VIAGRA) 50 MG tablet   Other Visit Diagnoses     Annual physical exam    -  Primary   Overall excellent health. Discussed recommended screenigns and immunizations.   Screening for prostate cancer        Relevant Orders   PSA   Screening for lipid disorders       Relevant Orders   Lipid panel   Need for Td vaccine       Relevant Orders   Td vaccine greater than or equal to 7yo preservative free IM       Return in about 1 year (around 02/10/2024) for Annual preventative care.   Loyola Mast, MD

## 2023-02-10 NOTE — Assessment & Plan Note (Signed)
I recommend a trial of Viagra 25-50 mg as needed.

## 2024-02-16 ENCOUNTER — Encounter: Payer: Self-pay | Admitting: Family Medicine

## 2024-02-16 ENCOUNTER — Ambulatory Visit: Payer: 59 | Admitting: Family Medicine

## 2024-02-16 ENCOUNTER — Encounter: Payer: 59 | Admitting: Family Medicine

## 2024-02-16 ENCOUNTER — Ambulatory Visit: Payer: Self-pay | Admitting: Family Medicine

## 2024-02-16 VITALS — BP 138/78 | HR 57 | Temp 97.9°F | Ht 71.0 in | Wt 178.0 lb

## 2024-02-16 DIAGNOSIS — E785 Hyperlipidemia, unspecified: Secondary | ICD-10-CM | POA: Diagnosis not present

## 2024-02-16 DIAGNOSIS — Z23 Encounter for immunization: Secondary | ICD-10-CM

## 2024-02-16 DIAGNOSIS — Z0000 Encounter for general adult medical examination without abnormal findings: Secondary | ICD-10-CM | POA: Diagnosis not present

## 2024-02-16 DIAGNOSIS — Z85828 Personal history of other malignant neoplasm of skin: Secondary | ICD-10-CM | POA: Insufficient documentation

## 2024-02-16 LAB — LIPID PANEL
Cholesterol: 215 mg/dL — ABNORMAL HIGH (ref 0–200)
HDL: 77.6 mg/dL (ref >39.00)
LDL Cholesterol: 125 mg/dL — ABNORMAL HIGH (ref 0–99)
NonHDL: 137.13
Total CHOL/HDL Ratio: 3
Triglycerides: 61 mg/dL (ref 0.0–149.0)
VLDL: 12.2 mg/dL (ref 0.0–40.0)

## 2024-02-16 NOTE — Assessment & Plan Note (Signed)
 Continue Viagra  25-50 mg as needed.

## 2024-02-16 NOTE — Assessment & Plan Note (Signed)
 Overall health is excellent. Recommend ongoing regular exercise. Discussed recommended screenings and immunizations. We will provide a flu vaccine today. He will consider pneumococcal vaccination. Discussed PSA testing. As he is 53, not at high risk, had normal values in the past, and does not have LUTS, I do not recommend a PSA at this time.

## 2024-02-16 NOTE — Assessment & Plan Note (Signed)
 I will reassess lipids today. Overall his CV risk is low and he should continue to focus on lifestyle approaches.

## 2024-02-16 NOTE — Progress Notes (Signed)
 [facility] [address] [place] [address] Dept: [phone] Dept Fax: [phone]  Annual Physical Visit  Subjective:    Patient ID: Anthony Mccormick, male    DOB: [birth year], 53 y.o..   MRN: [id number]  Chief Complaint  Patient presents with   Annual Exam    CPE/labs.  Fasting today.  C/o ? Umbilical hernia/lab back pain from moving furniture x months.  No OTC meds taken   History of Present Illness:  Patient is in today for an annual physical/preventative visit.  Continues regular physical activity, esp. mountain biking.  Review of Systems  Constitutional:  Negative for chills, diaphoresis, fever, malaise/fatigue and weight loss.  HENT:  Positive for congestion. Negative for ear pain, hearing loss, sinus pain, sore throat and tinnitus.        Has some seasonal allergy issues. he finds these are typically not significant enough to require intervention.  Eyes:  Negative for blurred vision, pain, discharge and redness.  Respiratory:  Positive for cough. Negative for shortness of breath and wheezing.        Has some intermittent cough. This typically is in the early morning hours and clears during the day. He is not particularly bothered by this.  Cardiovascular:  Negative for chest pain and palpitations.  Gastrointestinal:  Positive for abdominal pain and heartburn. Negative for constipation, diarrhea, nausea and vomiting.       Occasional heartburn with spicy foods as triggers.  Has had some pain just above the umbilicus since he did some heavier lifting in July. This had improved, but then flared again recently with lifting.  Genitourinary: Negative.   Musculoskeletal:  Positive for back pain. Negative for joint pain and myalgias.       Has had some lower back discomfort and stiffness. This also stirred up after doing lifting while helping his daughter move to [place] in July. He has been focusing on improved body  mechanics and posture to try and resolve.  Skin:  Positive for rash. Negative for itching.       Has some chronic skin changes related to prior sun exposure. He follows with dermatology.  Psychiatric/Behavioral:  Negative for depression. The patient is not nervous/anxious.    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   Erectile dysfunction 02/10/2023   Diverticulosis 04/08/2021   Basal cell carcinoma 01/22/2021   Past Surgical History:  Procedure Laterality Date   VASECTOMY  2010   Family History  Problem Relation Age of Onset   Heart disease Mother    Stroke Mother    Cancer Paternal Aunt        Ovarian   Dementia Maternal Grandmother    Heart disease Maternal Grandfather    Heart disease Paternal Grandmother    Heart disease Paternal Grandfather    Colon cancer Neg Hx    Esophageal cancer Neg Hx    Rectal cancer Neg Hx    Stomach cancer Neg Hx    Outpatient Medications Prior to Visit  Medication Sig Dispense Refill   sildenafil  (VIAGRA ) 50 MG tablet Take 1 tablet (50 mg total) by mouth daily as needed for erectile dysfunction. 10 tablet 6   No facility-administered medications prior to visit.   No Known Allergies Objective:   Today's Vitals   02/16/24 0827  BP: 138/78  Pulse: (!) 57  Temp: 97.9 F (36.6 C)  TempSrc: Temporal  SpO2: 99%  Weight: 178 lb (80.7 kg)  Height: 5' 11 (1.803 m)   Body mass index is 24.83 kg/m.   General: Well developed, well nourished. No acute distress. HEENT: Normocephalic, non-traumatic. PERRL, EOMI. Conjunctiva clear. External ears normal. EAC and TMs normal   bilaterally. Nose clear without congestion or rhinorrhea. Mucous membranes moist. Oropharynx clear. Good dentition. Neck: Supple. No lymphadenopathy. No thyromegaly. Lungs: Clear to auscultation bilaterally. No wheezing, rales or rhonchi. CV: RRR without murmurs or rubs. Pulses 2+  bilaterally. Abdomen: Soft. Bowel sounds positive, normal pitch and frequency. No hepatosplenomegaly. There is mild tenderness   above and to the left of the umbilicus. No palpable abdominal wall defect. No hernia with valsalva. No sign of   diastasis. No rebound or guarding. Back: Straight. No pain on palpation over the lumbar spine or paralumbar muscle columns. Extremities: Full ROM. No joint swelling or tenderness. No edema noted. Skin: Warm and dry. Areas on the back of the hand with slight scaliness  Neuro: CN II-XII intact. Normal sensation and DTR bilaterally. Psych: Alert and oriented. Normal mood and affect.  Health Maintenance Due  Topic Date Due   Hepatitis B Vaccines 19-59 Average Risk (1 of 3 - 19+ 3-dose series) Never done   Pneumococcal Vaccine: 50+ Years (1 of 1 - PCV) Never done   Influenza Vaccine  11/10/2023   COVID-19 Vaccine (5 - 2025-26 season) 12/11/2023   Lab Results Last lipids Lab Results  Component Value Date   CHOL 224 (H) 02/10/2023   HDL 79.60 02/10/2023   LDLCALC 126 (H) 02/10/2023   TRIG 94.0 02/10/2023   CHOLHDL 3 02/10/2023     Lab Results  Component Value Date   PSA 0.59 02/10/2023   PSA 0.35 02/08/2022   PSA 0.43 01/22/2021   Assessment & Plan:   Problem List Items Addressed This Visit       Other   Annual physical exam - Primary   Overall health is excellent. Recommend ongoing regular exercise. Discussed recommended screenings and immunizations. We will provide a flu vaccine today. He will consider pneumococcal vaccination. Discussed PSA testing. As he is 53, not at high risk, had normal values in the past, and does not have LUTS, I do not recommend a PSA at this time.       Borderline hyperlipidemia   I will reassess lipids today. Overall his CV risk is low and he should continue to focus on lifestyle approaches.      Relevant Orders   Lipid panel   Other Visit Diagnoses       Need for immunization against influenza        Relevant Orders   Flu vaccine trivalent PF, 6mos and older(Flulaval,Afluria,Fluarix,Fluzone) (Completed)       Return in about 1 year (around 02/15/2025) for Annual preventative care.   Garnette CHRISTELLA Simpler, MD  I,Emily Lagle,acting as a scribe for Garnette CHRISTELLA Simpler, MD.,have documented all relevant documentation on the behalf of Garnette CHRISTELLA Simpler, MD.  I, Garnette CHRISTELLA Simpler, MD, have reviewed all documentation for this visit. The documentation on 02/16/2024 for the exam, diagnosis, procedures, and orders are all accurate and complete.

## 2025-02-18 ENCOUNTER — Encounter: Admitting: Family Medicine
